# Patient Record
Sex: Female | Born: 1995 | Race: White | Hispanic: No | Marital: Single | Smoking: Former smoker
Health system: Southern US, Community
[De-identification: ages and names within clinical notes are randomized; demographics above are authoritative.]

## PROBLEM LIST (undated history)

## (undated) ENCOUNTER — Inpatient Hospital Stay (HOSPITAL_COMMUNITY): Payer: Self-pay

## (undated) DIAGNOSIS — Z8619 Personal history of other infectious and parasitic diseases: Secondary | ICD-10-CM

## (undated) DIAGNOSIS — K219 Gastro-esophageal reflux disease without esophagitis: Secondary | ICD-10-CM

## (undated) HISTORY — DX: Personal history of other infectious and parasitic diseases: Z86.19

## (undated) HISTORY — PX: WISDOM TOOTH EXTRACTION: SHX21

---
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
## 2014-01-16 ENCOUNTER — Encounter (HOSPITAL_COMMUNITY): Payer: Self-pay

## 2014-07-07 ENCOUNTER — Encounter (HOSPITAL_COMMUNITY): Payer: Self-pay | Admitting: *Deleted

## 2014-07-07 ENCOUNTER — Inpatient Hospital Stay (HOSPITAL_COMMUNITY)
Admission: AD | Admit: 2014-07-07 | Discharge: 2014-07-07 | Disposition: A | Discharge status: Home / Self Care | Payer: Medicaid Other | Source: Ambulatory Visit | Attending: Obstetrics & Gynecology | Admitting: Obstetrics & Gynecology

## 2014-07-07 DIAGNOSIS — Z87891 Personal history of nicotine dependence: Secondary | ICD-10-CM | POA: Diagnosis not present

## 2014-07-07 DIAGNOSIS — Z3A33 33 weeks gestation of pregnancy: Secondary | ICD-10-CM | POA: Diagnosis not present

## 2014-07-07 DIAGNOSIS — O9989 Other specified diseases and conditions complicating pregnancy, childbirth and the puerperium: Secondary | ICD-10-CM | POA: Diagnosis present

## 2014-07-07 DIAGNOSIS — O23593 Infection of other part of genital tract in pregnancy, third trimester: Secondary | ICD-10-CM | POA: Insufficient documentation

## 2014-07-07 DIAGNOSIS — N760 Acute vaginitis: Secondary | ICD-10-CM | POA: Insufficient documentation

## 2014-07-07 DIAGNOSIS — O2343 Unspecified infection of urinary tract in pregnancy, third trimester: Secondary | ICD-10-CM | POA: Diagnosis not present

## 2014-07-07 DIAGNOSIS — B9689 Other specified bacterial agents as the cause of diseases classified elsewhere: Secondary | ICD-10-CM | POA: Insufficient documentation

## 2014-07-07 HISTORY — DX: Gastro-esophageal reflux disease without esophagitis: K21.9

## 2014-07-07 LAB — URINALYSIS, ROUTINE W REFLEX MICROSCOPIC
BILIRUBIN URINE: NEGATIVE
GLUCOSE, UA: NEGATIVE mg/dL
KETONES UR: NEGATIVE mg/dL
Nitrite: NEGATIVE
PH: 6.5 (ref 5.0–8.0)
PROTEIN: NEGATIVE mg/dL
Specific Gravity, Urine: 1.02 (ref 1.005–1.030)
Urobilinogen, UA: 0.2 mg/dL (ref 0.0–1.0)

## 2014-07-07 LAB — URINE MICROSCOPIC-ADD ON

## 2014-07-07 LAB — WET PREP, GENITAL
Trich, Wet Prep: NONE SEEN
Yeast Wet Prep HPF POC: NONE SEEN

## 2014-07-07 LAB — AMNISURE RUPTURE OF MEMBRANE (ROM) NOT AT ARMC: AMNISURE: NEGATIVE

## 2014-07-07 MED ORDER — METRONIDAZOLE 500 MG PO TABS: 500.0000 mg | ORAL_TABLET | Freq: Two times a day (BID) | ORAL | Status: DC

## 2014-07-07 NOTE — MAU Note (Signed)
Contractions, un-timed, braxton hicks per patient  Complains of small amount bright red vaginal bleeding. Has lost mucous plug   Positive bloody show per patient   Decreased  fetal movement.  Complains of SROM/LOF since 1630 today

## 2014-07-07 NOTE — Discharge Instructions (Signed)
Asymptomatic Bacteriuria °Asymptomatic bacteriuria is the presence of a large number of bacteria in your urine without the usual symptoms of burning or frequent urination. The following conditions increase the risk of asymptomatic bacteriuria: °· Diabetes mellitus. °· Advanced age. °· Pregnancy in the first trimester. °· Kidney stones. °· Kidney transplants. °· Leaky kidney tube valve in young children (reflux). °Treatment for this condition is not needed in most people and can lead to other problems such as too much yeast and growth of resistant bacteria. However, some people, such as pregnant women, do need treatment to prevent kidney infection. Asymptomatic bacteriuria in pregnancy is also associated with fetal growth restriction, premature labor, and newborn death. °HOME CARE INSTRUCTIONS °Monitor your condition for any changes. The following actions may help to relieve any discomfort you are feeling: °· Drink enough water and fluids to keep your urine clear or pale yellow. Go to the bathroom more often to keep your bladder empty. °· Keep the area around your vagina and rectum clean. Wipe yourself from front to back after urinating. °SEEK IMMEDIATE MEDICAL CARE IF: °· You develop signs of an infection such as: °¨ Burning with urination. °¨ Frequency of voiding. °¨ Back pain. °¨ Fever. °· You have blood in the urine. °· You develop a fever. °MAKE SURE YOU: °· Understand these instructions. °· Will watch your condition. °· Will get help right away if you are not doing well or get worse. °Document Released: 03/03/2005 Document Revised: 07/18/2013 Document Reviewed: 08/23/2012 °ExitCare® Patient Information ©2015 [employer]. This information is not intended to replace advice given to you by your health care provider. Make sure you discuss any questions you have with your health care provider. °Bacterial Vaginosis °Bacterial vaginosis is a vaginal infection that occurs when the normal balance of bacteria in the  vagina is disrupted. It results from an overgrowth of certain bacteria. This is the most common vaginal infection in women of childbearing age. Treatment is important to prevent complications, especially in pregnant women, as it can cause a premature delivery. °CAUSES  °Bacterial vaginosis is caused by an increase in harmful bacteria that are normally present in smaller amounts in the vagina. Several different kinds of bacteria can cause bacterial vaginosis. However, the reason that the condition develops is not fully understood. °RISK FACTORS °Certain activities or behaviors can put you at an increased risk of developing bacterial vaginosis, including: °· Having a new sex partner or multiple sex partners. °· Douching. °· Using an intrauterine device (IUD) for contraception. °Women do not get bacterial vaginosis from toilet seats, bedding, swimming pools, or contact with objects around them. °SIGNS AND SYMPTOMS  °Some women with bacterial vaginosis have no signs or symptoms. Common symptoms include: °· Grey vaginal discharge. °· A fishlike odor with discharge, especially after sexual intercourse. °· Itching or burning of the vagina and vulva. °· Burning or pain with urination. °DIAGNOSIS  °Your health care provider will take a medical history and examine the vagina for signs of bacterial vaginosis. A sample of vaginal fluid may be taken. Your health care provider will look at this sample under a microscope to check for bacteria and abnormal cells. A vaginal pH test may also be done.  °TREATMENT  °Bacterial vaginosis may be treated with antibiotic medicines. These may be given in the form of a pill or a vaginal cream. A second round of antibiotics may be prescribed if the condition comes back after treatment.  °HOME CARE INSTRUCTIONS  °· Only take over-the-counter or prescription   medicines as directed by your health care provider. °· If antibiotic medicine was prescribed, take it as directed. Make sure you finish it  even if you start to feel better. °· Do not have sex until treatment is completed. °· Tell all sexual partners that you have a vaginal infection. They should see their health care provider and be treated if they have problems, such as a mild rash or itching. °· Practice safe sex by using condoms and only having one sex partner. °SEEK MEDICAL CARE IF:  °· Your symptoms are not improving after 3 days of treatment. °· You have increased discharge or pain. °· You have a fever. °MAKE SURE YOU:  °· Understand these instructions. °· Will watch your condition. °· Will get help right away if you are not doing well or get worse. °FOR MORE INFORMATION  °Centers for Disease Control and Prevention, Division of STD Prevention: www.cdc.gov/std °American Sexual Health Association (ASHA): www.ashastd.org  °Document Released: 03/03/2005 Document Revised: 12/22/2012 Document Reviewed: 10/13/2012 °ExitCare® Patient Information ©2015 [employer]. This information is not intended to replace advice given to you by your health care provider. Make sure you discuss any questions you have with your health care provider. ° °

## 2014-07-07 NOTE — MAU Provider Note (Signed)
Chief Complaint: Rupture of Membranes   First Provider Initiated Contact with Patient 07/07/14 1845     SUBJECTIVE HPI: Peggy Dodson is a 19 y.o. G2P1001 at [redacted]w[redacted]d by LMP who reports losing her mucous plug last night and at 1645 today having a large gush of clear fluid which ran down her legs prior to urinating. States she has been leaking since then. Denies contractions. Denied vaginal bleeding until during her time in triage stated she had bright red blood on the toilet paper. Antecedent intercourse about one day ago. She reports decreased fetal movement earlier today; now perceiving good FM. No irritative vaginal discharge. Denies dysuria or urgency but has had frequent urination.  Addendum: States she picked up a prescription for Macrobid for UTI today. It had been called in to pharmacy 1-1/2 weeks ago after her last prenatal visit but she has not begun taking it yet.   Past Medical History  Diagnosis Date  . Hx of varicella   . GERD (gastroesophageal reflux disease)    OB History  Gravida Para Term Preterm AB SAB TAB Ectopic Multiple Living  # Outcome Date GA Lbr Len/2nd Weight Sex Delivery Anes PTL Lv  2 Current           1 Term 06/22/12 [redacted]w[redacted]d  8 lb 8.5 oz (3.87 kg) F CS-LTranv Spinal  Y     Past Surgical History  Procedure Laterality Date  . Wisdom tooth extraction    . Cesarean section N/A 06/22/2012    Procedure: CESAREAN SECTION;  Surgeon: Freddrick March. Tenny Craw, MD;  Location: [facility];  Service: Obstetrics;  Laterality: N/A;   History   Social History  . Marital Status: Single    Spouse Name: N/A  . Number of Children: N/A  . Years of Education: N/A   Occupational History  . Not on file.   Social History Main Topics  . Smoking status: Former Smoker    Quit date: 12/15/2013  . Smokeless tobacco: Never Used  . Alcohol Use: No  . Drug Use: No  . Sexual Activity: Yes    Birth Control/ Protection: None   Other Topics Concern  . Not on file   Social  History Narrative   No current facility-administered medications on file prior to encounter.   Current Outpatient Prescriptions on File Prior to Encounter  Medication Sig Dispense Refill  . Prenatal Vit-Fe Fumarate-FA (PRENATAL MULTIVITAMIN) TABS Take 1 tablet by mouth at bedtime.    Marland Kitchen oxyCODONE-acetaminophen (PERCOCET/ROXICET) 5-325 MG per tablet Take 1-2 tablets by mouth every 4 (four) hours as needed. (Patient not taking: Reported on 07/07/2014) 30 tablet 0   No Known Allergies  ROS  OBJECTIVE Blood pressure 116/63, pulse 81, temperature 97.8 F (36.6 C), resp. rate 18, height  (1.702 m), weight 192 lb (87.091 kg), last menstrual period 11/17/2013, unknown if currently breastfeeding. GENERAL: Well-developed, well-nourished female in no acute distress. Peri-pad dry. HEART: normal rate RESP: normal effort GI: Abdomen soft, non-tender. S=D MS: Nontender, no edema; neg CVAT NEURO: Alert and oriented SSE: Slight labial edema, physiologic discharge, no blood noted, cervix clean; neg pool; neg fern   LAB RESULTS Results for orders placed or performed during the hospital encounter of 07/07/14 (from the past 24 hour(s))  Urinalysis, Routine w reflex microscopic     Status: Abnormal   Collection Time: 07/07/14  5:50 PM  Result Value Ref Range   Color, Urine YELLOW  YELLOW   APPearance CLOUDY (A) CLEAR   Specific Gravity, Urine 1.020 1.005 - 1.030   pH 6.5 5.0 - 8.0   Glucose, UA NEGATIVE NEGATIVE mg/dL   Hgb urine dipstick SMALL (A) NEGATIVE   Bilirubin Urine NEGATIVE NEGATIVE   Ketones, ur NEGATIVE NEGATIVE mg/dL   Protein, ur NEGATIVE NEGATIVE mg/dL   Urobilinogen, UA 0.2 0.0 - 1.0 mg/dL   Nitrite NEGATIVE NEGATIVE   Leukocytes, UA LARGE (A) NEGATIVE  Urine microscopic-add on     Status: Abnormal   Collection Time: 07/07/14  5:50 PM  Result Value Ref Range   Squamous Epithelial / LPF RARE RARE   WBC, UA TOO NUMEROUS TO COUNT <3 WBC/hpf   RBC / HPF 3-6 <3 RBC/hpf    Bacteria, UA MANY (A) RARE  Amnisure rupture of membrane (rom)     Status: None   Collection Time: 07/07/14  7:05 PM  Result Value Ref Range   Amnisure ROM NEGATIVE   Wet prep, genital     Status: Abnormal   Collection Time: 07/07/14  7:38 PM  Result Value Ref Range   Yeast Wet Prep HPF POC NONE SEEN NONE SEEN   Trich, Wet Prep NONE SEEN NONE SEEN   Clue Cells Wet Prep HPF POC FEW (A) NONE SEEN   WBC, Wet Prep HPF POC FEW (A) NONE SEEN    IMAGING No results found.  MAU COURSE C/W Dr. [facility]inn [facility]   ASSESSMENT 1. UTI (urinary tract infection) during pregnancy, third trimester   2. BV (bacterial vaginosis)   G2P1001 at 3220w1d, without evident SROM or VB  PLAN Discharge home in stable condition. PTL and FM precautions    Medication List    STOP taking these medications        oxyCODONE-acetaminophen 5-325 MG per tablet  Commonly known as:  PERCOCET/ROXICET      TAKE these medications        metroNIDAZOLE 500 MG tablet  Commonly known as:  FLAGYL  Take 1 tablet (500 mg total) by mouth 2 (two) times daily.     prenatal multivitamin Tabs tablet  Take 1 tablet by mouth at bedtime.      Start taking the Macrobid (prescribed through office) tonight. Increase fluids.  Follow-up Information    [facility] Sanjuana MaeWALDA STACIA, MD.   Specialty:  Obstetrics and Gynecology   Why:  Keep your scheduled prenatal appointment   Contact information:   [address] [place] [id number] [phone]       Danae OrleansDeirdre C Casimir Barcellos, CNM 07/07/2014  6:55 PM

## 2014-07-07 NOTE — MAU Note (Signed)
Pt presents to MAU with complaints of a large gush of fluid around 5 today. Denies any vaginal bleeding or contractions

## 2014-07-31 LAB — OB RESULTS CONSOLE GBS: STREP GROUP B AG: NEGATIVE

## 2014-08-16 ENCOUNTER — Encounter (HOSPITAL_COMMUNITY): Payer: Self-pay | Admitting: *Deleted

## 2014-08-16 ENCOUNTER — Inpatient Hospital Stay (HOSPITAL_COMMUNITY)
Admission: AD | Admit: 2014-08-16 | Discharge: 2014-08-16 | Disposition: A | Discharge status: Home / Self Care | Payer: Medicaid Other | Source: Ambulatory Visit | Attending: Obstetrics and Gynecology | Admitting: Obstetrics and Gynecology

## 2014-08-16 DIAGNOSIS — Z3A39 39 weeks gestation of pregnancy: Secondary | ICD-10-CM | POA: Insufficient documentation

## 2014-08-16 DIAGNOSIS — O9989 Other specified diseases and conditions complicating pregnancy, childbirth and the puerperium: Secondary | ICD-10-CM | POA: Diagnosis present

## 2014-08-16 NOTE — Discharge Instructions (Signed)

## 2014-08-16 NOTE — MAU Note (Signed)
Contractions, denies bleeding or ROM 

## 2015-05-12 ENCOUNTER — Encounter (HOSPITAL_COMMUNITY): Payer: Self-pay | Admitting: *Deleted

## 2016-04-22 LAB — OB RESULTS CONSOLE ABO/RH: RH Type: POSITIVE

## 2016-04-22 LAB — OB RESULTS CONSOLE HIV ANTIBODY (ROUTINE TESTING): HIV: NONREACTIVE

## 2016-04-22 LAB — OB RESULTS CONSOLE HEPATITIS B SURFACE ANTIGEN: Hepatitis B Surface Ag: NEGATIVE

## 2016-04-22 LAB — OB RESULTS CONSOLE ANTIBODY SCREEN: Antibody Screen: NEGATIVE

## 2016-04-22 LAB — OB RESULTS CONSOLE GC/CHLAMYDIA
Chlamydia: POSITIVE
GC PROBE AMP, GENITAL: NEGATIVE

## 2016-04-22 LAB — OB RESULTS CONSOLE RUBELLA ANTIBODY, IGM: Rubella: IMMUNE

## 2016-04-22 LAB — OB RESULTS CONSOLE RPR: RPR: NONREACTIVE

## 2016-05-20 ENCOUNTER — Other Ambulatory Visit: Payer: Self-pay

## 2016-05-20 LAB — OB RESULTS CONSOLE GC/CHLAMYDIA: CHLAMYDIA, DNA PROBE: NEGATIVE

## 2016-06-10 ENCOUNTER — Other Ambulatory Visit: Payer: Self-pay | Admitting: Obstetrics and Gynecology

## 2016-06-10 ENCOUNTER — Other Ambulatory Visit: Payer: Self-pay

## 2016-06-10 ENCOUNTER — Encounter (HOSPITAL_COMMUNITY): Payer: Self-pay | Admitting: *Deleted

## 2016-06-10 ENCOUNTER — Inpatient Hospital Stay (HOSPITAL_COMMUNITY)
Admission: AD | Admit: 2016-06-10 | Discharge: 2016-06-10 | Disposition: A | Discharge status: Home / Self Care | Payer: Medicaid Other | Source: Ambulatory Visit | Attending: Obstetrics and Gynecology | Admitting: Obstetrics and Gynecology

## 2016-06-10 DIAGNOSIS — O26892 Other specified pregnancy related conditions, second trimester: Secondary | ICD-10-CM | POA: Insufficient documentation

## 2016-06-10 DIAGNOSIS — Z3A20 20 weeks gestation of pregnancy: Secondary | ICD-10-CM | POA: Insufficient documentation

## 2016-06-10 DIAGNOSIS — R55 Syncope and collapse: Secondary | ICD-10-CM | POA: Insufficient documentation

## 2016-06-10 NOTE — MAU Note (Signed)
PT taken over to admitting for EKG. PT came to MAU on error

## 2016-06-10 NOTE — MAU Note (Signed)
Pt presents to MAU for follow up after being evaluated by Dr Henderson CloudHorvath for an abnormal anatomy scan today. Denies pain or VB

## 2016-06-13 ENCOUNTER — Other Ambulatory Visit (HOSPITAL_COMMUNITY): Payer: Self-pay | Admitting: Obstetrics and Gynecology

## 2016-06-13 DIAGNOSIS — O283 Abnormal ultrasonic finding on antenatal screening of mother: Secondary | ICD-10-CM

## 2016-06-13 DIAGNOSIS — Z3A19 19 weeks gestation of pregnancy: Secondary | ICD-10-CM

## 2016-06-13 DIAGNOSIS — Z3689 Encounter for other specified antenatal screening: Secondary | ICD-10-CM

## 2016-06-17 ENCOUNTER — Ambulatory Visit (HOSPITAL_COMMUNITY)
Admission: RE | Admit: 2016-06-17 | Discharge: 2016-06-17 | Disposition: A | Discharge status: Home / Self Care | Payer: Medicaid Other | Source: Ambulatory Visit | Attending: Obstetrics and Gynecology | Admitting: Obstetrics and Gynecology

## 2016-06-17 ENCOUNTER — Encounter (HOSPITAL_COMMUNITY): Payer: Self-pay

## 2016-06-17 DIAGNOSIS — Z3689 Encounter for other specified antenatal screening: Secondary | ICD-10-CM | POA: Insufficient documentation

## 2016-06-17 DIAGNOSIS — O283 Abnormal ultrasonic finding on antenatal screening of mother: Secondary | ICD-10-CM | POA: Insufficient documentation

## 2016-06-17 DIAGNOSIS — Z363 Encounter for antenatal screening for malformations: Secondary | ICD-10-CM | POA: Diagnosis not present

## 2016-06-17 DIAGNOSIS — Z3A19 19 weeks gestation of pregnancy: Secondary | ICD-10-CM | POA: Diagnosis not present

## 2016-06-17 DIAGNOSIS — O34219 Maternal care for unspecified type scar from previous cesarean delivery: Secondary | ICD-10-CM | POA: Insufficient documentation

## 2016-06-18 ENCOUNTER — Other Ambulatory Visit (HOSPITAL_COMMUNITY): Payer: Self-pay | Admitting: *Deleted

## 2016-06-18 DIAGNOSIS — O359XX0 Maternal care for (suspected) fetal abnormality and damage, unspecified, not applicable or unspecified: Secondary | ICD-10-CM

## 2016-07-15 ENCOUNTER — Ambulatory Visit (HOSPITAL_COMMUNITY)
Admission: RE | Admit: 2016-07-15 | Discharge: 2016-07-15 | Disposition: A | Discharge status: Home / Self Care | Payer: Medicaid Other | Source: Ambulatory Visit | Attending: Obstetrics and Gynecology | Admitting: Obstetrics and Gynecology

## 2016-07-15 ENCOUNTER — Encounter (HOSPITAL_COMMUNITY): Payer: Self-pay

## 2016-09-10 ENCOUNTER — Encounter (HOSPITAL_COMMUNITY): Payer: Self-pay | Admitting: *Deleted

## 2016-09-10 ENCOUNTER — Inpatient Hospital Stay (HOSPITAL_COMMUNITY)
Admission: AD | Admit: 2016-09-10 | Discharge: 2016-09-10 | Disposition: A | Discharge status: Home / Self Care | Payer: Medicaid Other | Source: Ambulatory Visit | Attending: Obstetrics | Admitting: Obstetrics

## 2016-09-10 DIAGNOSIS — O9989 Other specified diseases and conditions complicating pregnancy, childbirth and the puerperium: Secondary | ICD-10-CM | POA: Diagnosis not present

## 2016-09-10 DIAGNOSIS — B3731 Acute candidiasis of vulva and vagina: Secondary | ICD-10-CM

## 2016-09-10 DIAGNOSIS — R197 Diarrhea, unspecified: Secondary | ICD-10-CM

## 2016-09-10 DIAGNOSIS — Z87891 Personal history of nicotine dependence: Secondary | ICD-10-CM | POA: Diagnosis not present

## 2016-09-10 DIAGNOSIS — B373 Candidiasis of vulva and vagina: Secondary | ICD-10-CM | POA: Insufficient documentation

## 2016-09-10 DIAGNOSIS — Z3A31 31 weeks gestation of pregnancy: Secondary | ICD-10-CM | POA: Insufficient documentation

## 2016-09-10 DIAGNOSIS — O98813 Other maternal infectious and parasitic diseases complicating pregnancy, third trimester: Secondary | ICD-10-CM | POA: Insufficient documentation

## 2016-09-10 DIAGNOSIS — O368130 Decreased fetal movements, third trimester, not applicable or unspecified: Secondary | ICD-10-CM

## 2016-09-10 DIAGNOSIS — Z8249 Family history of ischemic heart disease and other diseases of the circulatory system: Secondary | ICD-10-CM | POA: Diagnosis not present

## 2016-09-10 DIAGNOSIS — Z3689 Encounter for other specified antenatal screening: Secondary | ICD-10-CM

## 2016-09-10 DIAGNOSIS — N898 Other specified noninflammatory disorders of vagina: Secondary | ICD-10-CM | POA: Diagnosis present

## 2016-09-10 LAB — URINALYSIS, ROUTINE W REFLEX MICROSCOPIC
Bilirubin Urine: NEGATIVE
Glucose, UA: NEGATIVE mg/dL
Hgb urine dipstick: NEGATIVE
Ketones, ur: 5 mg/dL — AB
NITRITE: NEGATIVE
PH: 7 (ref 5.0–8.0)
Protein, ur: 30 mg/dL — AB
SPECIFIC GRAVITY, URINE: 1.016 (ref 1.005–1.030)

## 2016-09-10 LAB — WET PREP, GENITAL
CLUE CELLS WET PREP: NONE SEEN
SPERM: NONE SEEN
TRICH WET PREP: NONE SEEN

## 2016-09-10 LAB — POCT FERN TEST: POCT Fern Test: NEGATIVE

## 2016-09-10 MED ORDER — TERCONAZOLE 0.8 % VA CREA: 1.0000 | TOPICAL_CREAM | Freq: Every day | VAGINAL | 0 refills | Status: DC

## 2016-09-10 NOTE — MAU Note (Signed)
Lost her mucous plug, small amt of watery d/c no bleeding. Having contractions, feeling pelvic pressure.  No hx of PTL.  BP has been elevated,pt is not on any meds for it.

## 2016-09-10 NOTE — Discharge Instructions (Signed)
Nausea medication to take during pregnancy:  Unisom (doxylamine succinate 25 mg tablets) Take one tablet daily at bedtime. If symptoms are not adequately controlled, the dose can be increased to a maximum recommended dose of two tablets daily (1/2 tablet in the morning, 1/2 tablet mid-afternoon and one at bedtime). Vitamin B6 100mg  tablets. Take one tablet twice a day (up to 200 mg per day).     Diarrhea, Adult Diarrhea is frequent loose and watery bowel movements. Diarrhea can make you feel weak and cause you to become dehydrated. Dehydration can make you tired and thirsty, cause you to have a dry mouth, and decrease how often you urinate. Diarrhea typically lasts 2-3 days. However, it can last longer if it is a sign of something more serious. It is important to treat your diarrhea as told by your health care provider. Follow these instructions at home: Eating and drinking  Follow these recommendations as told by your health care provider:  Take an oral rehydration solution (ORS). This is a drink that is sold at pharmacies and retail stores.  Drink clear fluids, such as water, ice chips, diluted fruit juice, and low-calorie sports drinks.  Eat bland, easy-to-digest foods in small amounts as you are able. These foods include bananas, applesauce, rice, lean meats, toast, and crackers.  Avoid drinking fluids that contain a lot of sugar or caffeine, such as energy drinks, sports drinks, and soda.  Avoid alcohol.  Avoid spicy or fatty foods.  General instructions  Drink enough fluid to keep your urine clear or pale yellow.  Wash your hands often. If soap and water are not available, use hand sanitizer.  Make sure that all people in your household wash their hands well and often.  Take over-the-counter and prescription medicines only as told by your health care provider.  Rest at home while you recover.  Watch your condition for any changes.  Take a warm bath to relieve any burning  or pain from frequent diarrhea episodes.  Keep all follow-up visits as told by your health care provider. This is important. Contact a health care provider if:  You have a fever.  Your diarrhea gets worse.  You have new symptoms.  You cannot keep fluids down.  You feel light-headed or dizzy.  You have a headache  You have muscle cramps. Get help right away if:  You have chest pain.  You feel extremely weak or you faint.  You have bloody or black stools or stools that look like tar.  You have severe pain, cramping, or bloating in your abdomen.  You have trouble breathing or you are breathing very quickly.  Your heart is beating very quickly.  Your skin feels cold and clammy.  You feel confused.  You have signs of dehydration, such as: ? Dark urine, very little urine, or no urine. ? Cracked lips. ? Dry mouth. ? Sunken eyes. ? Sleepiness. ? Weakness. This information is not intended to replace advice given to you by your health care provider. Make sure you discuss any questions you have with your health care provider. Document Released: 02/21/2002 Document Revised: 07/12/2015 Document Reviewed: 11/07/2014 Elsevier Interactive Patient Education  2017 Elsevier Inc.  [place] Diet A bland diet consists of foods that do not have a lot of fat or fiber. Foods without fat or fiber are easier for the body to digest. They are also less likely to irritate your mouth, throat, stomach, and other parts of your gastrointestinal tract. A bland diet is sometimes  called a BRAT diet. What is my plan? Your health care provider or dietitian may recommend specific changes to your diet to prevent and treat your symptoms, such as:  Eating small meals often.  Cooking food until it is soft enough to chew easily.  Chewing your food well.  Drinking fluids slowly.  Not eating foods that are very spicy, sour, or fatty.  Not eating citrus fruits, such as oranges and grapefruit.  What do  I need to know about this diet?  Eat a variety of foods from the bland diet food list.  Do not follow a bland diet longer than you have to.  Ask your health care provider whether you should take vitamins. What foods can I eat? Grains  Hot cereals, such as cream of wheat. Bread, crackers, or tortillas made from refined white flour. Rice. Vegetables Canned or cooked vegetables. Mashed or boiled potatoes. Fruits Bananas. Applesauce. Other types of cooked or canned fruit with the skin and seeds removed, such as canned peaches or pears. Meats and Other Protein Sources Scrambled eggs. Creamy peanut butter or other nut butters. Lean, well-cooked meats, such as chicken or fish. Tofu. Soups or broths. Dairy Low-fat dairy products, such as milk, cottage cheese, or yogurt. Beverages Water. Herbal tea. Apple juice. Sweets and Desserts Pudding. Custard. Fruit gelatin. Ice cream. Fats and Oils Mild salad dressings. Canola or olive oil. The items listed above may not be a complete list of allowed foods or beverages. Contact your dietitian for more options. What foods are not recommended? Foods and ingredients that are often not recommended include:  Spicy foods, such as hot sauce or salsa.  Fried foods.  Sour foods, such as pickled or fermented foods.  Raw vegetables or fruits, especially citrus or berries.  Caffeinated drinks.  Alcohol.  Strongly flavored seasonings or condiments.  The items listed above may not be a complete list of foods and beverages that are not allowed. Contact your dietitian for more information. This information is not intended to replace advice given to you by your health care provider. Make sure you discuss any questions you have with your health care provider. Document Released: 06/25/2015 Document Revised: 08/09/2015 Document Reviewed: 03/15/2014 Elsevier Interactive Patient Education  2018 Elsevier Inc.  Fetal Movement Counts Patient Name:  ________________________________________________ Patient Due Date: ____________________ What is a fetal movement count? A fetal movement count is the number of times that you feel your baby move during a certain amount of time. This may also be called a fetal kick count. A fetal movement count is recommended for every pregnant woman. You may be asked to start counting fetal movements as early as week 28 of your pregnancy. Pay attention to when your baby is most active. You may notice your baby's sleep and wake cycles. You may also notice things that make your baby move more. You should do a fetal movement count:  When your baby is normally most active.  At the same time each day.  A good time to count movements is while you are resting, after having something to eat and drink. How do I count fetal movements? 1. Find a quiet, comfortable area. Sit, or lie down on your side. 2. Write down the date, the start time and stop time, and the number of movements that you felt between those two times. Take this information with you to your health care visits. 3. For 2 hours, count kicks, flutters, swishes, rolls, and jabs. You should feel at least 10 movements  during 2 hours. 4. You may stop counting after you have felt 10 movements. 5. If you do not feel 10 movements in 2 hours, have something to eat and drink. Then, keep resting and counting for 1 hour. If you feel at least 4 movements during that hour, you may stop counting. Contact a health care provider if:  You feel fewer than 4 movements in 2 hours.  Your baby is not moving like he or she usually does. Date: ____________ Start time: ____________ Stop time: ____________ Movements: ____________ Date: ____________ Start time: ____________ Stop time: ____________ Movements: ____________ Date: ____________ Start time: ____________ Stop time: ____________ Movements: ____________ Date: ____________ Start time: ____________ Stop time: ____________  Movements: ____________ Date: ____________ Start time: ____________ Stop time: ____________ Movements: ____________ Date: ____________ Start time: ____________ Stop time: ____________ Movements: ____________ Date: ____________ Start time: ____________ Stop time: ____________ Movements: ____________ Date: ____________ Start time: ____________ Stop time: ____________ Movements: ____________ Date: ____________ Start time: ____________ Stop time: ____________ Movements: ____________ This information is not intended to replace advice given to you by your health care provider. Make sure you discuss any questions you have with your health care provider. Document Released: 04/02/2006 Document Revised: 10/31/2015 Document Reviewed: 04/12/2015 Elsevier Interactive Patient Education  [employer]2018 Elsevier Inc.

## 2016-09-10 NOTE — MAU Note (Signed)
Has been vomiting and having watery stools for 2 wks, it was at that time that the cramping and contractions started.

## 2016-09-10 NOTE — MAU Provider Note (Signed)
History     CSN: [id number]  Arrival date and time: 09/10/16 1317  First Provider Initiated Contact with Patient 09/10/16 1353   Chief Complaint  Patient presents with  . Vaginal Discharge  . Contractions  . Diarrhea  . Decreased Fetal Movement   HPI  Peggy Dodson is a 21 y.o. G3P2002 at [redacted]w[redacted]d who presents with multiple complaints. Reports decreased fetal movement x 2 days. Has felt movement once per day since Monday.  Lower abdominal cramping x 2 weeks that comes & goes. Rates pain 4/10. Has not treated. Can't tell how frequently pain occurs. Vaginal discharge since this morning. Describes as alternating between watery fluid & mucous plug. Keeps underwear wet; not gushing or trickling. Denies vaginal bleeding. Endorses diarrhea x 2 weeks. Reports 4 episodes of watery stools daily. Denies fever/chills, vomiting, sick contacts, recent travel, antibiotics, or hospitalization. No recent intercourse.   OB History    Gravida Para Term Preterm AB Living   3 2 2     2    SAB TAB Ectopic Multiple Live Births         1 2      Past Medical History:  Diagnosis Date  . GERD (gastroesophageal reflux disease)   . Hx of varicella     Past Surgical History:  Procedure Laterality Date  . CESAREAN SECTION N/A 06/22/2012   Procedure: CESAREAN SECTION;  Surgeon: Freddrick March. Tenny Craw, MD;  Location: [facility];  Service: Obstetrics;  Laterality: N/A;  . WISDOM TOOTH EXTRACTION      Family History  Problem Relation Age of Onset  . Hypertension Father   . Alcohol abuse Paternal Grandfather   . Cancer Paternal Grandfather        liver  . COPD Maternal Grandmother     Social History  Substance Use Topics  . Smoking status: Former Smoker    Types: Cigarettes    Quit date: 12/15/2013  . Smokeless tobacco: Never Used  . Alcohol use No    Allergies: No Known Allergies  Prescriptions Prior to Admission  Medication Sig Dispense Refill Last Dose  . metroNIDAZOLE (FLAGYL) 500 MG tablet Take 1 tablet  (500 mg total) by mouth 2 (two) times daily. (Patient not taking: Reported on 06/17/2016) 14 tablet 0 Not Taking  . Prenatal Vit-Fe Fumarate-FA (PRENATAL MULTIVITAMIN) TABS Take 1 tablet by mouth at bedtime.   Taking    Review of Systems  Constitutional: Negative.   Gastrointestinal: Positive for abdominal pain, diarrhea and nausea. Negative for blood in stool, constipation and vomiting.  Genitourinary: Positive for vaginal discharge. Negative for dysuria and vaginal bleeding.  Musculoskeletal: Negative for back pain.   Physical Exam   Blood pressure (!) 124/50, pulse 82, temperature 98.2 F (36.8 C), resp. rate 18, weight 203 lb 8 oz (92.3 kg), last menstrual period 01/31/2016, SpO2 98 %, unknown if currently breastfeeding.  Physical Exam  Nursing note and vitals reviewed. Constitutional: She is oriented to person, place, and time. She appears well-developed and well-nourished. No distress.  HENT:  Head: Normocephalic and atraumatic.  Eyes: Conjunctivae are normal. Right eye exhibits no discharge. Left eye exhibits no discharge. No scleral icterus.  Neck: Normal range of motion.  Cardiovascular: Normal rate, regular rhythm and normal heart sounds.   No murmur heard. Respiratory: Effort normal and breath sounds normal. No respiratory distress. She has no wheezes.  GI: Soft. There is no tenderness.  Genitourinary: Cervix exhibits no friability. No bleeding in the vagina. Vaginal discharge (small amount of thin  white discharge adherant to vaginal walls) found.  Genitourinary Comments: No pooling  Neurological: She is alert and oriented to person, place, and time.  Skin: Skin is warm and dry. She is not diaphoretic.  Psychiatric: She has a normal mood and affect. Her behavior is normal. Judgment and thought content normal.   Dilation: Closed Effacement (%): Thick Exam by:: Emila Steinhauser NP  Fetal Tracing:  Baseline: 140 Variability: moderate Accelerations: 15x15 Decelerations:  none  Toco: irregular UI MAU Course  Procedures Results for orders placed or performed during the hospital encounter of 09/10/16 (from the past 24 hour(s))  Urinalysis, Routine w reflex microscopic     Status: Abnormal   Collection Time: 09/10/16  1:44 PM  Result Value Ref Range   Color, Urine YELLOW YELLOW   APPearance CLOUDY (A) CLEAR   Specific Gravity, Urine 1.016 1.005 - 1.030   pH 7.0 5.0 - 8.0   Glucose, UA NEGATIVE NEGATIVE mg/dL   Hgb urine dipstick NEGATIVE NEGATIVE   Bilirubin Urine NEGATIVE NEGATIVE   Ketones, ur 5 (A) NEGATIVE mg/dL   Protein, ur 30 (A) NEGATIVE mg/dL   Nitrite NEGATIVE NEGATIVE   Leukocytes, UA LARGE (A) NEGATIVE   RBC / HPF 0-5 0 - 5 RBC/hpf   WBC, UA 6-30 0 - 5 WBC/hpf   Bacteria, UA RARE (A) NONE SEEN   Squamous Epithelial / LPF TOO NUMEROUS TO COUNT (A) NONE SEEN   Mucous PRESENT    Non Squamous Epithelial 0-5 (A) NONE SEEN  POCT fern test     Status: None   Collection Time: 09/10/16  2:05 PM  Result Value Ref Range   POCT Fern Test Negative = intact amniotic membranes   Wet prep, genital     Status: Abnormal   Collection Time: 09/10/16  2:50 PM  Result Value Ref Range   Yeast Wet Prep HPF POC PRESENT (A) NONE SEEN   Trich, Wet Prep NONE SEEN NONE SEEN   Clue Cells Wet Prep HPF POC NONE SEEN NONE SEEN   WBC, Wet Prep HPF POC MANY (A) NONE SEEN   Sperm NONE SEEN     MDM Reactive fetal tracing Reports normal fetal movement since being on monitor No pooling, fern negative. Wet prep shows yeast Cervix closed; no regular ctx on TOCO No diarrhea while in MAU S/w Dr. Chestine Sporelark. Ok to discharge home Assessment and Plan  A: 1. Decreased fetal movements in third trimester, single or unspecified fetus   2. NST (non-stress test) reactive   3. Vaginal yeast infection   4. Diarrhea, unspecified type    P: Discharge home Rx terazol Fetal kick counts reviewed BRAT diet for diarrhea Discussed reasons to return to MAU Keep follow up appointment  with OB/PCP   Judeth HornErin Aayla Marrocco 09/10/2016, 1:53 PM

## 2016-10-17 ENCOUNTER — Telehealth (HOSPITAL_COMMUNITY): Payer: Self-pay | Admitting: *Deleted

## 2016-10-17 ENCOUNTER — Encounter (HOSPITAL_COMMUNITY): Payer: Self-pay

## 2016-10-17 NOTE — Telephone Encounter (Signed)
Preadmission screen  

## 2016-10-20 ENCOUNTER — Encounter (HOSPITAL_COMMUNITY): Payer: Self-pay

## 2016-10-30 ENCOUNTER — Other Ambulatory Visit: Payer: Self-pay | Admitting: Obstetrics and Gynecology

## 2016-10-30 ENCOUNTER — Inpatient Hospital Stay (HOSPITAL_COMMUNITY)
Admission: AD | Admit: 2016-10-30 | Discharge: 2016-11-01 | DRG: 766 | Disposition: A | Discharge status: Home / Self Care | Payer: Medicaid Other | Source: Ambulatory Visit | Attending: Obstetrics | Admitting: Obstetrics

## 2016-10-30 DIAGNOSIS — Z3A39 39 weeks gestation of pregnancy: Secondary | ICD-10-CM

## 2016-10-30 DIAGNOSIS — O34211 Maternal care for low transverse scar from previous cesarean delivery: Secondary | ICD-10-CM | POA: Diagnosis present

## 2016-10-30 DIAGNOSIS — O99824 Streptococcus B carrier state complicating childbirth: Secondary | ICD-10-CM | POA: Diagnosis present

## 2016-10-30 DIAGNOSIS — Z87891 Personal history of nicotine dependence: Secondary | ICD-10-CM

## 2016-10-30 DIAGNOSIS — O4202 Full-term premature rupture of membranes, onset of labor within 24 hours of rupture: Secondary | ICD-10-CM | POA: Diagnosis present

## 2016-10-30 LAB — CBC
HEMATOCRIT: 31.3 % — AB (ref 36.0–46.0)
Hemoglobin: 10.7 g/dL — ABNORMAL LOW (ref 12.0–15.0)
MCH: 29.7 pg (ref 26.0–34.0)
MCHC: 34.2 g/dL (ref 30.0–36.0)
MCV: 86.9 fL (ref 78.0–100.0)
PLATELETS: 197 10*3/uL (ref 150–400)
RBC: 3.6 MIL/uL — ABNORMAL LOW (ref 3.87–5.11)
RDW: 13.6 % (ref 11.5–15.5)
WBC: 12.3 10*3/uL — ABNORMAL HIGH (ref 4.0–10.5)

## 2016-10-30 LAB — POCT FERN TEST: POCT Fern Test: POSITIVE

## 2016-10-30 MED ORDER — CEFAZOLIN SODIUM-DEXTROSE 2-4 GM/100ML-% IV SOLN
2.0000 g | INTRAVENOUS | Status: AC
  Administered 2016-10-31: 2 g via INTRAVENOUS
  Filled 2016-10-30: qty 100

## 2016-10-30 NOTE — MAU Note (Signed)
Pt presents with leaking fluid since 2100.  Pt reports fluid is clear.  States regular ctxs began once water broke.

## 2016-10-30 NOTE — H&P (Signed)
21 y.o. [id number] @ 3272w0d presents with LOF since 2100 and contractions q5 minutes.  Otherwise has good fetal movement and no bleeding.  Past Medical History:  Diagnosis Date  . GERD (gastroesophageal reflux disease)   . Hx of chlamydia infection   . Hx of varicella     Past Surgical History:  Procedure Laterality Date  . CESAREAN SECTION N/A 06/22/2012   Procedure: CESAREAN SECTION;  Surgeon: Freddrick MarchKendra H. Tenny Crawoss, MD;  Location: [facility];  Service: Obstetrics;  Laterality: N/A;  . WISDOM TOOTH EXTRACTION      OB History  Gravida Para Term Preterm AB Living  3 2 2     2   SAB TAB Ectopic Multiple Live Births        1 2    # Outcome Date GA Lbr Len/2nd Weight Sex Delivery Anes PTL Lv  3 Current           2 Term 2016     CS-Unspec  N LIV  1A Term 06/22/12 2465w3d  3.87 kg (8 lb 8.5 oz) F CS-LTranv Spinal  LIV  1B Term  4150w0d      N FD      Social History   Social History  . Marital status: Single    Spouse name: N/A  . Number of children: N/A  . Years of education: N/A   Occupational History  . Not on file.   Social History Main Topics  . Smoking status: Former Smoker    Types: Cigarettes    Quit date: 12/15/2013  . Smokeless tobacco: Never Used  . Alcohol use No  . Drug use: No  . Sexual activity: Yes    Birth control/ protection: None   Other Topics Concern  . Not on file   Social History Narrative  . No narrative on file   Patient has no known allergies.    Prenatal Transfer Tool  Maternal Diabetes: No Genetic Screening: Normal Maternal Ultrasounds/Referrals: Normal Fetal Ultrasounds or other Referrals:  None Maternal Substance Abuse:  No Significant Maternal Medications:  None Significant Maternal Lab Results: Lab values include: Other: + chlamydia in 04/2016. Negative test of cure  ABO, Rh: O/Positive/-- (02/06 0000) Antibody: Negative (02/06 0000) Rubella: Immune (02/06 0000) RPR: Nonreactive (02/06 0000)  HBsAg: Negative (02/06 0000)  HIV: Non-reactive (02/06  0000)  GBS:   Positive    Other PNC: uncomplicated.    Vitals:   10/30/16 2247  BP: 129/70  Pulse: 92  Resp: 20  Temp: 99.1 F (37.3 C)  SpO2: 97%     General:  NAD Abdomen:  soft, gravid Ex:  no edema SVE:  4/80-/2 FHTs:  120s, mod var, + accels, no decels Toco:  q3-5 min    A/P   21 y.o. [id number] 3872w0d presents with ROM for repeat cesarean section.   Discussed risks of cesarean section to include, but not limited to, infection, bleeding, damage to surrounding strutcures (including bowel, bladder, tubes, ovaries, nerves, vessels, baby), need for additional procedures, risk of blood clot, need for transfusion. Consent signed.  Ancef 2gm on call to OR  [facility] Hill[facility]ANNA GEFFEL Chestine SporeLARK

## 2016-10-30 NOTE — Patient Instructions (Signed)
20 Peggy MooreLacey R Dodson  10/30/2016   Your procedure is scheduled on:  11/03/2016  Enter through the [facility]tr[facility] of [facility] at 0630 AM.  Pick up the phone at the desk and dial [phone].   Call this number if you have problems the morning of surgery: [phone]   Remember:   Do not eat food:After Midnight.  Do not drink clear liquids: After Midnight.  Take these medicines the morning of surgery with A SIP OF WATER: none   Do not wear jewelry, make-up or nail polish.  Do not wear lotions, powders, or perfumes. Do not wear deodorant.  Do not shave 48 hours prior to surgery.  Do not bring valuables to the hospital.  [facility] is not   responsible for any belongings or valuables brought to the hospital.  Contacts, dentures or bridgework may not be worn into surgery.  Leave suitcase in the car. After surgery it may be brought to your room.  For patients admitted to the hospital, checkout time is 11:00 AM the day of              discharge.   Patients discharged the day of surgery will not be allowed to drive             home.  Name and phone number of your driver: na  Special Instructions:   N/A   Please read over the following fact sheets that you were given:   Surgical Site Infection Prevention

## 2016-10-31 ENCOUNTER — Encounter (HOSPITAL_COMMUNITY)
Admission: RE | Admit: 2016-10-31 | Discharge: 2016-10-31 | Disposition: A | Discharge status: Home / Self Care | Payer: Medicaid Other | Source: Ambulatory Visit

## 2016-10-31 ENCOUNTER — Encounter (HOSPITAL_COMMUNITY)
Admission: AD | Disposition: A | Discharge status: Home / Self Care | Payer: Self-pay | Source: Ambulatory Visit | Attending: Obstetrics

## 2016-10-31 ENCOUNTER — Inpatient Hospital Stay (HOSPITAL_COMMUNITY): Payer: Medicaid Other | Admitting: Anesthesiology

## 2016-10-31 ENCOUNTER — Encounter (HOSPITAL_COMMUNITY): Payer: Self-pay | Admitting: Anesthesiology

## 2016-10-31 DIAGNOSIS — O4202 Full-term premature rupture of membranes, onset of labor within 24 hours of rupture: Secondary | ICD-10-CM | POA: Diagnosis present

## 2016-10-31 HISTORY — DX: Personal history of other infectious and parasitic diseases: Z86.19

## 2016-10-31 LAB — CBC
HCT: 27.8 % — ABNORMAL LOW (ref 36.0–46.0)
Hemoglobin: 9.8 g/dL — ABNORMAL LOW (ref 12.0–15.0)
MCH: 30.4 pg (ref 26.0–34.0)
MCHC: 35.3 g/dL (ref 30.0–36.0)
MCV: 86.3 fL (ref 78.0–100.0)
PLATELETS: 177 10*3/uL (ref 150–400)
RBC: 3.22 MIL/uL — AB (ref 3.87–5.11)
RDW: 13.7 % (ref 11.5–15.5)
WBC: 12.6 10*3/uL — AB (ref 4.0–10.5)

## 2016-10-31 LAB — RPR: RPR Ser Ql: NONREACTIVE

## 2016-10-31 LAB — TYPE AND SCREEN
ABO/RH(D): O POS
Antibody Screen: NEGATIVE

## 2016-10-31 LAB — RAPID HIV SCREEN (HIV 1/2 AB+AG)
HIV 1/2 Antibodies: NONREACTIVE
HIV-1 P24 ANTIGEN - HIV24: NONREACTIVE

## 2016-10-31 SURGERY — Surgical Case
Anesthesia: Spinal | Site: Abdomen

## 2016-10-31 MED ORDER — PHENYLEPHRINE 8 MG IN D5W 100 ML (0.08MG/ML) PREMIX OPTIME
INJECTION | INTRAVENOUS | Status: AC
  Filled 2016-10-31: qty 100

## 2016-10-31 MED ORDER — PRENATAL MULTIVITAMIN CH
1.0000 | ORAL_TABLET | Freq: Every day | ORAL | Status: DC
  Administered 2016-10-31 – 2016-11-01 (×2): 1 via ORAL
  Filled 2016-10-31 (×2): qty 1

## 2016-10-31 MED ORDER — LACTATED RINGERS IV SOLN
INTRAVENOUS | Status: DC | PRN
  Administered 2016-10-31: 02:00:00 via INTRAVENOUS

## 2016-10-31 MED ORDER — MENTHOL 3 MG MT LOZG: 1.0000 | LOZENGE | OROMUCOSAL | Status: DC | PRN

## 2016-10-31 MED ORDER — SCOPOLAMINE 1 MG/3DAYS TD PT72
MEDICATED_PATCH | TRANSDERMAL | Status: AC
  Filled 2016-10-31: qty 1

## 2016-10-31 MED ORDER — ONDANSETRON HCL 4 MG/2ML IJ SOLN
INTRAMUSCULAR | Status: DC | PRN
  Administered 2016-10-31: 4 mg via INTRAVENOUS

## 2016-10-31 MED ORDER — TETANUS-DIPHTH-ACELL PERTUSSIS 5-2.5-18.5 LF-MCG/0.5 IM SUSP: 0.5000 mL | Freq: Once | INTRAMUSCULAR | Status: DC

## 2016-10-31 MED ORDER — IBUPROFEN 600 MG PO TABS
600.0000 mg | ORAL_TABLET | Freq: Four times a day (QID) | ORAL | Status: DC
  Administered 2016-10-31 – 2016-11-01 (×5): 600 mg via ORAL
  Filled 2016-10-31 (×6): qty 1

## 2016-10-31 MED ORDER — FENTANYL CITRATE (PF) 100 MCG/2ML IJ SOLN
INTRAMUSCULAR | Status: DC | PRN
  Administered 2016-10-31: 20 ug via INTRATHECAL

## 2016-10-31 MED ORDER — BUPIVACAINE IN DEXTROSE 0.75-8.25 % IT SOLN
INTRATHECAL | Status: DC | PRN
  Administered 2016-10-31: 12 mL via INTRATHECAL

## 2016-10-31 MED ORDER — SIMETHICONE 80 MG PO CHEW
80.0000 mg | CHEWABLE_TABLET | Freq: Three times a day (TID) | ORAL | Status: DC
  Administered 2016-10-31 – 2016-11-01 (×4): 80 mg via ORAL
  Filled 2016-10-31 (×5): qty 1

## 2016-10-31 MED ORDER — SODIUM CHLORIDE 0.9 % IR SOLN
Status: DC | PRN
  Administered 2016-10-31: 1000 mL

## 2016-10-31 MED ORDER — OXYTOCIN 40 UNITS IN LACTATED RINGERS INFUSION - SIMPLE MED: 2.5000 [IU]/h | INTRAVENOUS | Status: AC

## 2016-10-31 MED ORDER — SENNOSIDES-DOCUSATE SODIUM 8.6-50 MG PO TABS
2.0000 | ORAL_TABLET | ORAL | Status: DC
  Administered 2016-11-01: 2 via ORAL
  Filled 2016-10-31: qty 2

## 2016-10-31 MED ORDER — MORPHINE SULFATE (PF) 0.5 MG/ML IJ SOLN
INTRAMUSCULAR | Status: DC | PRN
  Administered 2016-10-31: .2 mg via INTRATHECAL

## 2016-10-31 MED ORDER — COCONUT OIL OIL
1.0000 "application " | TOPICAL_OIL | Status: DC | PRN
  Administered 2016-11-01: 1 via TOPICAL
  Filled 2016-10-31: qty 120

## 2016-10-31 MED ORDER — SOD CITRATE-CITRIC ACID 500-334 MG/5ML PO SOLN
ORAL | Status: AC
  Administered 2016-10-31: 30 mL
  Filled 2016-10-31: qty 15

## 2016-10-31 MED ORDER — FENTANYL CITRATE (PF) 100 MCG/2ML IJ SOLN
25.0000 ug | INTRAMUSCULAR | Status: DC | PRN
  Administered 2016-10-31: 25 ug via INTRAVENOUS

## 2016-10-31 MED ORDER — SCOPOLAMINE 1 MG/3DAYS TD PT72
MEDICATED_PATCH | TRANSDERMAL | Status: DC | PRN
  Administered 2016-10-31: 1 via TRANSDERMAL

## 2016-10-31 MED ORDER — ACETAMINOPHEN 325 MG PO TABS
650.0000 mg | ORAL_TABLET | ORAL | Status: DC | PRN
  Administered 2016-10-31 (×2): 650 mg via ORAL
  Filled 2016-10-31 (×2): qty 2

## 2016-10-31 MED ORDER — DEXAMETHASONE SODIUM PHOSPHATE 10 MG/ML IJ SOLN
INTRAMUSCULAR | Status: DC | PRN
  Administered 2016-10-31: 10 mg via INTRAVENOUS

## 2016-10-31 MED ORDER — SIMETHICONE 80 MG PO CHEW
80.0000 mg | CHEWABLE_TABLET | ORAL | Status: DC
  Administered 2016-11-01: 80 mg via ORAL
  Filled 2016-10-31: qty 1

## 2016-10-31 MED ORDER — MORPHINE SULFATE (PF) 0.5 MG/ML IJ SOLN
INTRAMUSCULAR | Status: AC
  Filled 2016-10-31: qty 10

## 2016-10-31 MED ORDER — OXYCODONE HCL 5 MG PO TABS: 5.0000 mg | ORAL_TABLET | ORAL | Status: DC | PRN

## 2016-10-31 MED ORDER — DIBUCAINE 1 % RE OINT: 1.0000 "application " | TOPICAL_OINTMENT | RECTAL | Status: DC | PRN

## 2016-10-31 MED ORDER — OXYTOCIN 10 UNIT/ML IJ SOLN
INTRAMUSCULAR | Status: DC | PRN
  Administered 2016-10-31: 40 [IU] via INTRAVENOUS

## 2016-10-31 MED ORDER — ONDANSETRON HCL 4 MG/2ML IJ SOLN: 4.0000 mg | Freq: Once | INTRAMUSCULAR | Status: DC | PRN

## 2016-10-31 MED ORDER — MEPERIDINE HCL 25 MG/ML IJ SOLN: 6.2500 mg | INTRAMUSCULAR | Status: DC | PRN

## 2016-10-31 MED ORDER — ONDANSETRON HCL 4 MG/2ML IJ SOLN
INTRAMUSCULAR | Status: AC
  Filled 2016-10-31: qty 2

## 2016-10-31 MED ORDER — SIMETHICONE 80 MG PO CHEW: 80.0000 mg | CHEWABLE_TABLET | ORAL | Status: DC | PRN

## 2016-10-31 MED ORDER — DEXAMETHASONE SODIUM PHOSPHATE 4 MG/ML IJ SOLN
INTRAMUSCULAR | Status: AC
  Filled 2016-10-31: qty 1

## 2016-10-31 MED ORDER — OXYCODONE HCL 5 MG PO TABS: 10.0000 mg | ORAL_TABLET | ORAL | Status: DC | PRN

## 2016-10-31 MED ORDER — FENTANYL CITRATE (PF) 100 MCG/2ML IJ SOLN
INTRAMUSCULAR | Status: AC
  Filled 2016-10-31: qty 2

## 2016-10-31 MED ORDER — LACTATED RINGERS IV SOLN
INTRAVENOUS | Status: DC
  Administered 2016-10-31: 14:00:00 via INTRAVENOUS

## 2016-10-31 MED ORDER — OXYTOCIN 10 UNIT/ML IJ SOLN
INTRAMUSCULAR | Status: AC
  Filled 2016-10-31: qty 4

## 2016-10-31 MED ORDER — DIPHENHYDRAMINE HCL 25 MG PO CAPS: 25.0000 mg | ORAL_CAPSULE | Freq: Four times a day (QID) | ORAL | Status: DC | PRN

## 2016-10-31 MED ORDER — PHENYLEPHRINE 8 MG IN D5W 100 ML (0.08MG/ML) PREMIX OPTIME
INJECTION | INTRAVENOUS | Status: DC | PRN
  Administered 2016-10-31: 60 ug/min via INTRAVENOUS

## 2016-10-31 MED ORDER — BUPIVACAINE IN DEXTROSE 0.75-8.25 % IT SOLN
INTRATHECAL | Status: AC
  Filled 2016-10-31: qty 2

## 2016-10-31 MED ORDER — WITCH HAZEL-GLYCERIN EX PADS: 1.0000 "application " | MEDICATED_PAD | CUTANEOUS | Status: DC | PRN

## 2016-10-31 MED ORDER — LACTATED RINGERS IV SOLN
INTRAVENOUS | Status: DC | PRN
  Administered 2016-10-31 (×2): via INTRAVENOUS

## 2016-10-31 SURGICAL SUPPLY — 35 items
BENZOIN TINCTURE PRP APPL 2/3 (GAUZE/BANDAGES/DRESSINGS) ×3 IMPLANT
CHLORAPREP W/TINT 26ML (MISCELLANEOUS) ×3 IMPLANT
CLAMP CORD UMBIL (MISCELLANEOUS) IMPLANT
CLOSURE WOUND 1/2 X4 (GAUZE/BANDAGES/DRESSINGS) ×1
CLOTH BEACON ORANGE TIMEOUT ST (SAFETY) ×3 IMPLANT
DERMABOND ADVANCED (GAUZE/BANDAGES/DRESSINGS)
DERMABOND ADVANCED .7 DNX12 (GAUZE/BANDAGES/DRESSINGS) IMPLANT
DRSG OPSITE POSTOP 4X10 (GAUZE/BANDAGES/DRESSINGS) ×3 IMPLANT
ELECT REM PT RETURN 9FT ADLT (ELECTROSURGICAL) ×3
ELECTRODE REM PT RTRN 9FT ADLT (ELECTROSURGICAL) ×1 IMPLANT
EXTRACTOR VACUUM M CUP 4 TUBE (SUCTIONS) IMPLANT
EXTRACTOR VACUUM M CUP 4' TUBE (SUCTIONS)
GLOVE BIO SURGEON STRL SZ7 (GLOVE) ×3 IMPLANT
GLOVE BIOGEL PI IND STRL 7.0 (GLOVE) ×1 IMPLANT
GLOVE BIOGEL PI INDICATOR 7.0 (GLOVE) ×2
GOWN STRL REUS W/TWL LRG LVL3 (GOWN DISPOSABLE) ×6 IMPLANT
KIT ABG SYR 3ML LUER SLIP (SYRINGE) IMPLANT
NEEDLE HYPO 22GX1.5 SAFETY (NEEDLE) IMPLANT
NEEDLE HYPO 25X5/8 SAFETYGLIDE (NEEDLE) IMPLANT
NS IRRIG 1000ML POUR BTL (IV SOLUTION) ×3 IMPLANT
PACK C SECTION WH (CUSTOM PROCEDURE TRAY) ×3 IMPLANT
PAD OB MATERNITY 4.3X12.25 (PERSONAL CARE ITEMS) ×3 IMPLANT
PENCIL SMOKE EVAC W/HOLSTER (ELECTROSURGICAL) ×3 IMPLANT
RTRCTR C-SECT PINK 25CM LRG (MISCELLANEOUS) ×3 IMPLANT
SPONGE LAP 18X18 X RAY DECT (DISPOSABLE) ×3 IMPLANT
STRIP CLOSURE SKIN 1/2X4 (GAUZE/BANDAGES/DRESSINGS) ×2 IMPLANT
SUT CHROMIC 1 CTX 36 (SUTURE) ×6 IMPLANT
SUT CHROMIC 2 0 CT 1 (SUTURE) ×3 IMPLANT
SUT PDS AB 0 CTX 60 (SUTURE) ×3 IMPLANT
SUT VIC AB 2-0 CT1 27 (SUTURE) ×2
SUT VIC AB 2-0 CT1 TAPERPNT 27 (SUTURE) ×1 IMPLANT
SUT VIC AB 4-0 KS 27 (SUTURE) IMPLANT
SYR 30ML LL (SYRINGE) IMPLANT
TOWEL OR 17X24 6PK STRL BLUE (TOWEL DISPOSABLE) ×3 IMPLANT
TRAY FOLEY BAG SILVER LF 14FR (SET/KITS/TRAYS/PACK) ×3 IMPLANT

## 2016-10-31 NOTE — Anesthesia Procedure Notes (Signed)
Spinal  Patient location during procedure: OR Start time: 10/31/2016 1:39 AM End time: 10/31/2016 1:45 AM Staffing Anesthesiologist: Tishana Clinkenbeard Preanesthetic Checklist Completed: patient identified, site marked, surgical consent, pre-op evaluation, timeout performed, IV checked, risks and benefits discussed and monitors and equipment checked Spinal Block Patient position: sitting Prep: DuraPrep Patient monitoring: heart rate, cardiac monitor, continuous pulse ox and blood pressure Approach: midline Location: L4-5 Injection technique: single-shot Needle Needle type: Sprotte  Needle gauge: 24 G Needle length: 9 cm Assessment Sensory level: T4

## 2016-10-31 NOTE — Transfer of Care (Signed)
Immediate Anesthesia Transfer of Care Note  Patient: Peggy Dodson  Procedure(s) Performed: Procedure(s): CESAREAN SECTION (N/A)  Patient Location: PACU  Anesthesia Type:Spinal  Level of Consciousness: awake, alert  and oriented  Airway & Oxygen Therapy: Patient Spontanous Breathing  Post-op Assessment: Report given to RN and Post -op Vital signs reviewed and stable  Post vital signs: Reviewed and stable  Last Vitals:  Vitals:   10/31/16 0259 10/31/16 0300  BP:    Pulse: 67 68  Resp: 18 17  Temp:    SpO2: 99% 100%    Last Pain:  Vitals:   10/31/16 0007  TempSrc: Oral  PainSc:          Complications: No apparent anesthesia complications

## 2016-10-31 NOTE — Lactation Note (Signed)
This note was copied from a baby's chart. Lactation Consultation Note  Patient Name: Peggy Dodson [id number] Date: 10/31/2016 Reason for consult: Initial assessment  Baby 10 hours old. Mom reports that she successfully nursed 2 older children. Mom attempted to latch baby in cradle position to left breast, but baby sleepy. Enc mom to support baby's head and latch in cross-cradle position, but baby sleepy still. Enc mom to hand express and dribble EBM in baby's mouth, which baby tolerated well, but still not willing to latch. Discussed newborn behavior and enc lots of STS and nursing/latching with cues. Mom given [facility] brochure, aware of OP/BFSG and LC phone line assistance after D/C.   Maternal Data Has patient been taught Hand Expression?: Yes Does the patient have breastfeeding experience prior to this delivery?: Yes  Feeding Feeding Type: Breast Fed Length of feed: 0 min  LATCH Score Latch: Too sleepy or reluctant, no latch achieved, no sucking elicited.  Audible Swallowing: None  Type of Nipple: Everted at rest and after stimulation  Comfort (Breast/Nipple): Soft / non-tender  Hold (Positioning): Assistance needed to correctly position infant at breast and maintain latch.  LATCH Score: 5  Interventions Interventions: Breast feeding basics reviewed;Assisted with latch;Skin to skin;Hand express;Breast compression;Adjust position;Support pillows;Position options;Expressed milk  Lactation Tools Discussed/Used     Consult Status Consult Status: Follow-up Date: 11/01/16 Follow-up type: In-patient    Sherlyn Hay 10/31/2016, 12:41 PM

## 2016-10-31 NOTE — Discharge Instructions (Signed)
Iron-Rich Diet  Iron is a mineral that helps your body to produce hemoglobin. Hemoglobin is a protein in your red blood cells that carries oxygen to your body's tissues. Eating too little iron may cause you to feel weak and tired, and it can increase your risk for infection. Eating enough iron is necessary for your body's metabolism, muscle function, and nervous system.  Iron is naturally found in many foods. It can also be added to foods or fortified in foods. There are two types of dietary iron:   Heme iron. Heme iron is absorbed by the body more easily than nonheme iron. Heme iron is found in meat, poultry, and fish.   Nonheme iron. Nonheme iron is found in dietary supplements, iron-fortified grains, beans, and vegetables.    You may need to follow an iron-rich diet if:   You have been diagnosed with iron deficiency or iron-deficiency anemia.   You have a condition that prevents you from absorbing dietary iron, such as:  ? Infection in your intestines.  ? Celiac disease. This involves long-lasting (chronic) inflammation of your intestines.   You do not eat enough iron.   You eat a diet that is high in foods that impair iron absorption.   You have lost a lot of blood.   You have heavy bleeding during your menstrual cycle.   You are pregnant.    What is my plan?  Your health care provider may help you to determine how much iron you need per day based on your condition. Generally, when a person consumes sufficient amounts of iron in the diet, the following iron needs are met:   Men.  ? 14-18 years old: 11 mg per day.  ? 19-50 years old: 8 mg per day.   Women.  ? 14-18 years old: 15 mg per day.  ? 19-50 years old: 18 mg per day.  ? Over 50 years old: 8 mg per day.  ? Pregnant women: 27 mg per day.  ? Breastfeeding women: 9 mg per day.    What do I need to know about an iron-rich diet?   Eat fresh fruits and vegetables that are high in vitamin C along with foods that are high in iron. This will help  increase the amount of iron that your body absorbs from food, especially with foods containing nonheme iron. Foods that are high in vitamin C include oranges, peppers, tomatoes, and mango.   Take iron supplements only as directed by your health care provider. Overdose of iron can be life-threatening. If you were prescribed iron supplements, take them with orange juice or a vitamin C supplement.   Cook foods in pots and pans that are made from iron.   Eat nonheme iron-containing foods alongside foods that are high in heme iron. This helps to improve your iron absorption.   Certain foods and drinks contain compounds that impair iron absorption. Avoid eating these foods in the same meal as iron-rich foods or with iron supplements. These include:  ? Coffee, black tea, and red wine.  ? Milk, dairy products, and foods that are high in calcium.  ? Beans, soybeans, and peas.  ? Whole grains.   When eating foods that contain both nonheme iron and compounds that impair iron absorption, follow these tips to absorb iron better.  ? Soak beans overnight before cooking.  ? Soak whole grains overnight and drain them before using.  ? Ferment flours before baking, such as using yeast in bread dough.    What foods can I eat?  Grains  Iron-fortified breakfast cereal. Iron-fortified whole-wheat bread. Enriched rice. Sprouted grains.  Vegetables  Spinach. Potatoes with skin. Green peas. Broccoli. Red and green bell peppers. Fermented vegetables.  Fruits  Prunes. Raisins. Oranges. Strawberries. Mango. Grapefruit.  Meats and Other Protein Sources  Beef liver. Oysters. Beef. Shrimp. Turkey. Chicken. Tuna. Sardines. Chickpeas. Nuts. Tofu.  Beverages  Tomato juice. Fresh orange juice. Prune juice. Hibiscus tea. Fortified instant breakfast shakes.  Condiments  Tahini. Fermented soy sauce.  Sweets and Desserts  Black-strap molasses.  Other  Wheat germ.  The items listed above may not be a complete list of recommended foods or beverages.  Contact your dietitian for more options.  What foods are not recommended?  Grains  Whole grains. Bran cereal. Bran flour. Oats.  Vegetables  Artichokes. Brussels sprouts. Kale.  Fruits  Blueberries. Raspberries. Strawberries. Figs.  Meats and Other Protein Sources  Soybeans. Products made from soy protein.  Dairy  Milk. Cream. Cheese. Yogurt. Cottage cheese.  Beverages  Coffee. Black tea. Red wine.  Sweets and Desserts  Cocoa. Chocolate. Ice cream.  Other  Basil. Oregano. Parsley.  The items listed above may not be a complete list of foods and beverages to avoid. Contact your dietitian for more information.  This information is not intended to replace advice given to you by your health care provider. Make sure you discuss any questions you have with your health care provider.  Document Released: 10/15/2004 Document Revised: 09/21/2015 Document Reviewed: 09/28/2013  Elsevier Interactive Patient Education  2018 [employer]

## 2016-10-31 NOTE — Anesthesia Preprocedure Evaluation (Signed)
Anesthesia Evaluation  Patient identified by MRN, date of birth, ID band Patient awake    Reviewed: Allergy & Precautions, H&P , NPO status , Patient's Chart, lab work & pertinent test results, reviewed documented beta blocker date and time   Airway Mallampati: I  TM Distance: >3 FB Neck ROM: full    Dental no notable dental hx.    Pulmonary neg pulmonary ROS, former smoker,    Pulmonary exam normal breath sounds clear to auscultation       Cardiovascular negative cardio ROS Normal cardiovascular exam Rhythm:regular Rate:Normal     Neuro/Psych negative neurological ROS  negative psych ROS   GI/Hepatic negative GI ROS, Neg liver ROS, GERD  ,  Endo/Other  negative endocrine ROS  Renal/GU negative Renal ROS  negative genitourinary   Musculoskeletal   Abdominal   Peds  Hematology negative hematology ROS (+)   Anesthesia Other Findings   Reproductive/Obstetrics (+) Pregnancy                             Anesthesia Physical Anesthesia Plan  ASA: II and emergent  Anesthesia Plan: Spinal   Post-op Pain Management:    Induction:   PONV Risk Score and Plan: 2 and Ondansetron, Dexamethasone, Treatment may vary due to age or medical condition and Treatment may vary due to age  Airway Management Planned:   Additional Equipment:   Intra-op Plan:   Post-operative Plan:   Informed Consent: I have reviewed the patients History and Physical, chart, labs and discussed the procedure including the risks, benefits and alternatives for the proposed anesthesia with the patient or authorized representative who has indicated his/her understanding and acceptance.     Plan Discussed with:   Anesthesia Plan Comments:         Anesthesia Quick Evaluation

## 2016-10-31 NOTE — Op Note (Signed)
Cesarean Section Procedure Note  Pre-operative Diagnosis: 1. Intrauterine pregnancy at [redacted]w[redacted]d  2. Rupture of membranes  Post-operative Diagnosis: same as above  Surgeon: Marlow Baars, MD  Procedure: Repeat low transverse cesarean section   Anesthesia: Spinal anesthesia  Estimated Blood Loss: 857 mL         Drains: Foley catheter                 Complications:  None; patient tolerated the procedure well.         Disposition: PACU - hemodynamically stable.  Findings:  Normal uterus, tubes and ovaries bilaterally.  Viable female infant, weight pending, Apgars 9, 9.    Procedure Details   After spinal anesthesia was found to adequate , the patient was placed in the dorsal supine position with a leftward tilt, draped and prepped in the usual sterile manner. A Pfannenstiel incision was made and carried down through the subcutaneous tissue to the fascia. The fascia was incised in the midline and the fascial incision was extended laterally with Mayo scissors. The superior aspect of the fascial incision was grasped with two Kocher clamp, tented up and the rectus muscles dissected off sharply. The rectus was then dissected off with blunt dissection and Mayo scissors inferiorly. The rectus muscles were separated in the midline. The abdominal peritoneum was identified, tented up, entered sharply, and the incision was extended superiorly and inferiorly with good visualization of the bladder. The Alexis retractor was deployed. The vesicouterine peritoneum was identified and sharp dissection was used to create the bladder flap. Scalpel was then used to make a low transverse incision on the uterus which was extended in the cephalad-caudad direction with blunt dissection. The fluid was clear. The fetal vertex was identified, elevated out of the pelvis and brought to the hysterotomy. The head was delivered easily followed by the shoulders and body. After one minute delay, the cord was clamped and cut and the  infant was passed to the waiting neonatologist. Placenta was then delivered spontaneously, intact and appear normal, the uterus was cleared of all clot and debris   The hysterotomy was repaired with #0 Monocryl in running locked fashion.  The hysterotomy was reexamined and excellent hemostasis was noted. The Alexis retractor was removed from the abdomen. The peritoneum was examined and all vessels noted to be hemostatic. The abdominal cavity was cleared of all clot and debris.  The peritoneum was closed with 2-0 vicryl in a running fashion. The rectus muscles were reapproximated with 2-0 vicryl in a running fashion. The fascia and rectus muscles were inspected and were hemostatic. The fascia was closed with 0 Vicryl in a running fashion. The subcuticular layer was irrigated and all bleeders cauterized.  The subcutaneous layer was re approximated with interrupted 3-0 plain gut.  The skin was closed with 3-0 monocryl in a subcuticular fashion. The incision was dressed with benzoine, steri strips and honeycomb dressing. All sponge lap and needle counts were correct x3. Patient tolerated the procedure well and recovered in stable condition following the procedure.

## 2016-10-31 NOTE — Brief Op Note (Signed)
10/30/2016 - 10/31/2016  2:46 AM  PATIENT:  Peggy Dodson  21 y.o. female  PRE-OPERATIVE DIAGNOSIS:  repeat cesarean section   POST-OPERATIVE DIAGNOSIS:  repeat cesarean section   PROCEDURE:  Procedure(s): CESAREAN SECTION (N/A)  SURGEON:  Surgeon(s) and Role:    Marlow Baars, MD - Primary  ANESTHESIA:   spinal  EBL:  Total I/O In: 2400 [I.V.:2400] Out: 957 [Urine:100; Blood:857]  BLOOD ADMINISTERED:none  DRAINS: none   LOCAL MEDICATIONS USED:  NONE  SPECIMEN:  No Specimen  DISPOSITION OF SPECIMEN:  N/A  COUNTS:  YES  TOURNIQUET:  * No tourniquets in log *  DICTATION: .Note written in EPIC  PLAN OF CARE: Admit to inpatient   PATIENT DISPOSITION:  PACU - hemodynamically stable.   Delay start of Pharmacological VTE agent (>24hrs) due to surgical blood loss or risk of bleeding: not applicable

## 2016-11-01 LAB — BIRTH TISSUE RECOVERY COLLECTION (PLACENTA DONATION)

## 2016-11-01 MED ORDER — IBUPROFEN 600 MG PO TABS: 600.0000 mg | ORAL_TABLET | Freq: Four times a day (QID) | ORAL | 0 refills | Status: AC

## 2016-11-01 MED ORDER — OXYCODONE HCL 5 MG PO TABS: 5.0000 mg | ORAL_TABLET | ORAL | 0 refills | Status: AC | PRN

## 2016-11-01 NOTE — Progress Notes (Signed)
POD#2 Pt desires discharge, She is doing well VSSAF IMP/ stable Plan/ Discharge

## 2016-11-01 NOTE — Discharge Summary (Signed)
Obstetric Discharge Summary Reason for Admission: cesarean section and rupture of membranes Prenatal Procedures: ultrasound Intrapartum Procedures: cesarean: low cervical, transverse Postpartum Procedures: none Complications-Operative and Postpartum: none Hemoglobin  Date Value Ref Range Status  10/31/2016 9.8 (L) 12.0 - 15.0 g/dL Final   HCT  Date Value Ref Range Status  10/31/2016 27.8 (L) 36.0 - 46.0 % Final    Physical Exam:  General: alert Lochia: appropriate Uterine Fundus: firm Incision: healing well DVT Evaluation: No evidence of DVT seen on physical exam.  Discharge Diagnoses: Term Pregnancy-delivered  Discharge Information: Date: 11/01/2016 Activity: pelvic rest Diet: routine Medications: PNV, Ibuprofen and Percocet Condition: stable Instructions: refer to practice specific booklet Discharge to: home Follow-up Information    Marlow Baars, MD. Schedule an appointment as soon as possible for a visit in 1 month(s).   Specialty:  Obstetrics Contact information: [address] [place] [address] [phone]           Newborn Data: Live born female  Birth Weight: 8 lb 6 oz (3800 g) APGAR: 9, 9  Home with mother.  ANDERSON,MARK E 11/01/2016, 8:28 AM

## 2016-11-03 ENCOUNTER — Inpatient Hospital Stay (HOSPITAL_COMMUNITY)
Admission: RE | Admit: 2016-11-03 | Payer: Medicaid Other | Source: Ambulatory Visit | Admitting: Obstetrics and Gynecology

## 2016-11-03 NOTE — Anesthesia Postprocedure Evaluation (Signed)
Anesthesia Post Note  Patient: IRISSA FOGLIA  Procedure(s) Performed: Procedure(s) (LRB): CESAREAN SECTION (N/A)     Patient location during evaluation: PACU Anesthesia Type: Spinal Level of consciousness: oriented and awake and alert Pain management: pain level controlled Vital Signs Assessment: post-procedure vital signs reviewed and stable Respiratory status: spontaneous breathing, respiratory function stable and patient connected to nasal cannula oxygen Cardiovascular status: blood pressure returned to baseline and stable Postop Assessment: no headache and no backache Anesthetic complications: no    Last Vitals:  Vitals:   11/01/16 0010 11/01/16 0540  BP: 114/64 113/70  Pulse: 61 62  Resp: 18 18  Temp: 36.4 C 36.9 C  SpO2:      Last Pain:  Vitals:   11/01/16 1142  TempSrc:   PainSc: 4                  Merrit Friesen

## 2017-04-14 IMAGING — US US MFM OB DETAIL+14 WK
1 series · 13 of 28 positions shown · non-contrast
Comparison: none

[Series 1: us mfm ob detail+14 wk · 67 acquisitions, 13 frames shown]
[im 3/67]
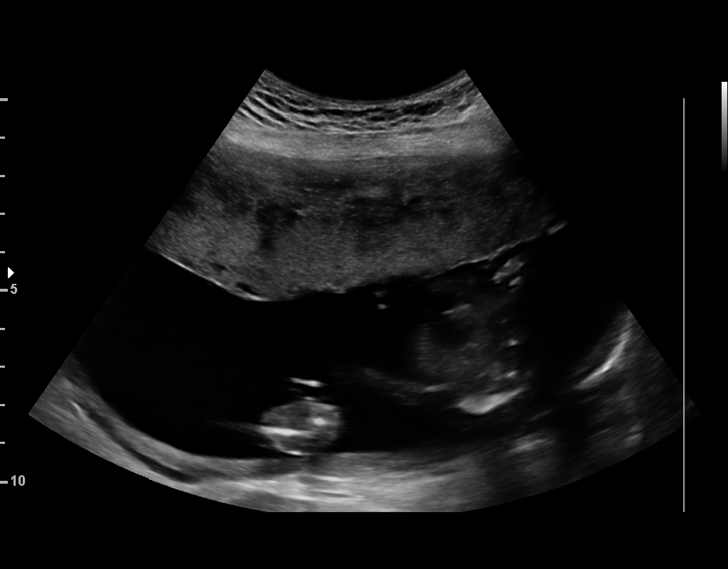
[im 8/67]
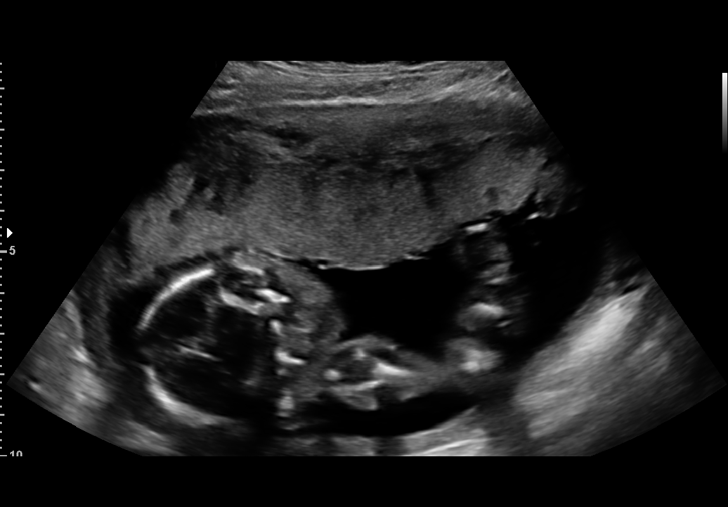
[im 13/67]
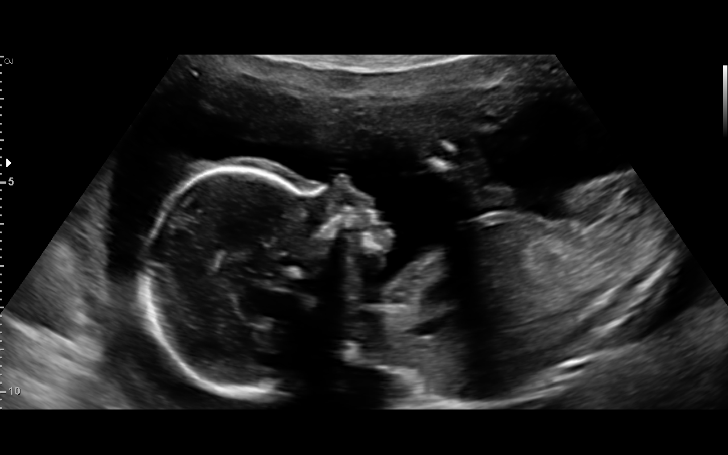
[im 18/67]
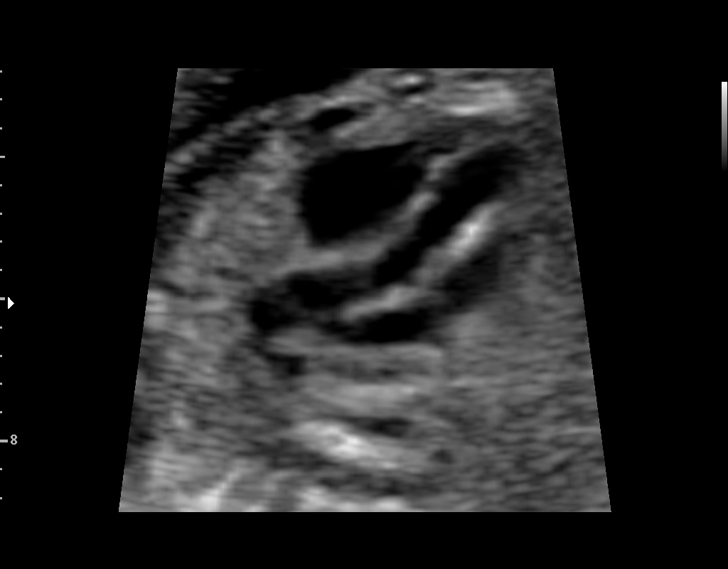
[im 23/67]
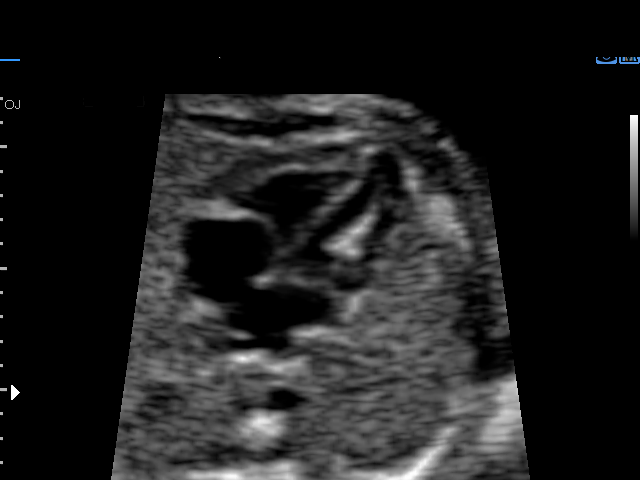
[im 27/67]
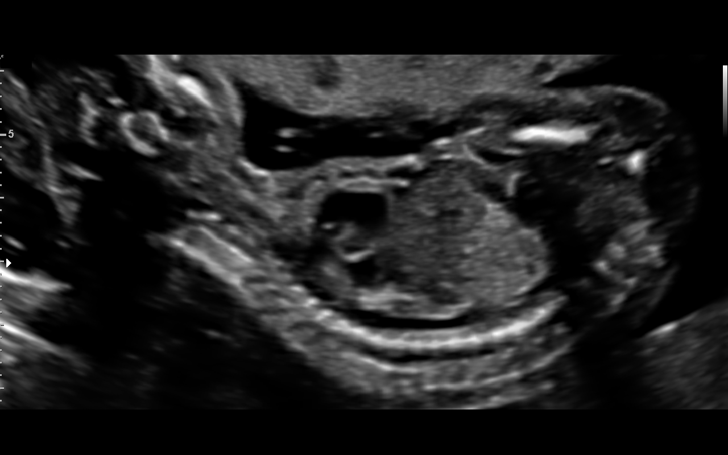
[im 35/67]
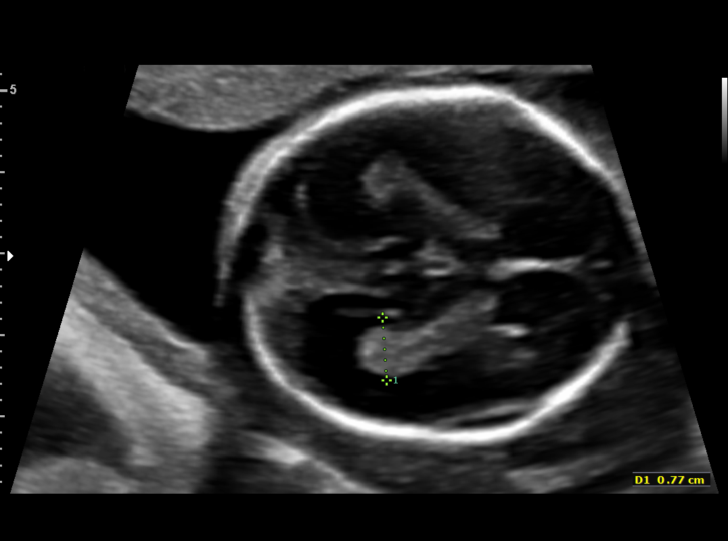
[im 40/67]
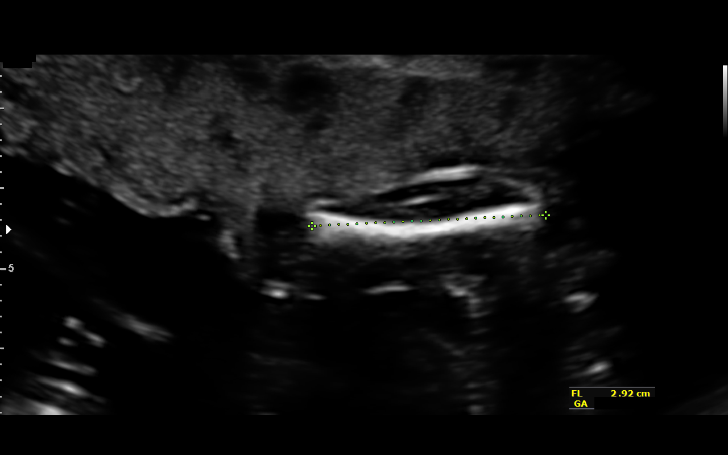
[im 45/67]
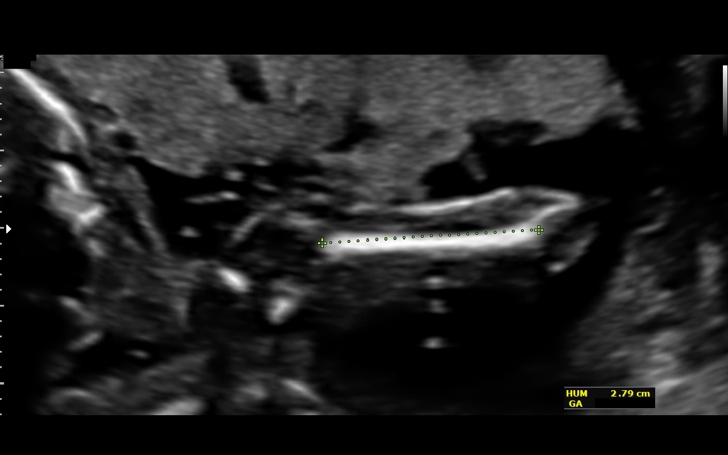
[im 49/67]
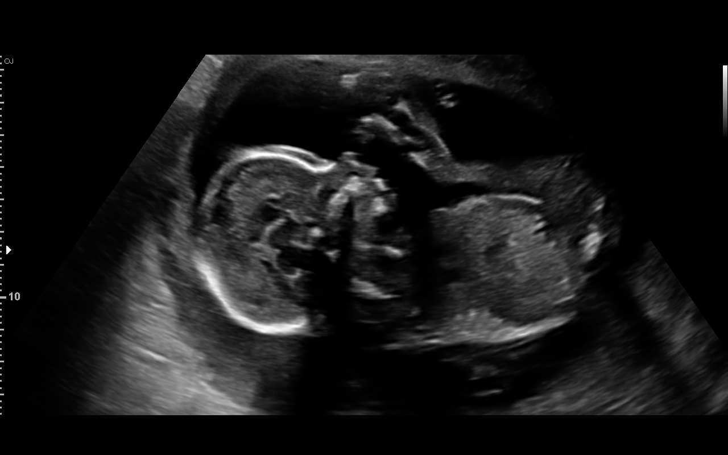
[im 54/67]
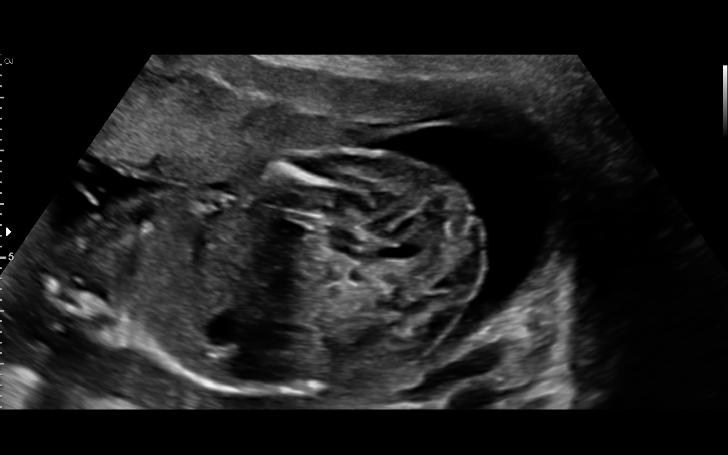
[im 59/67]
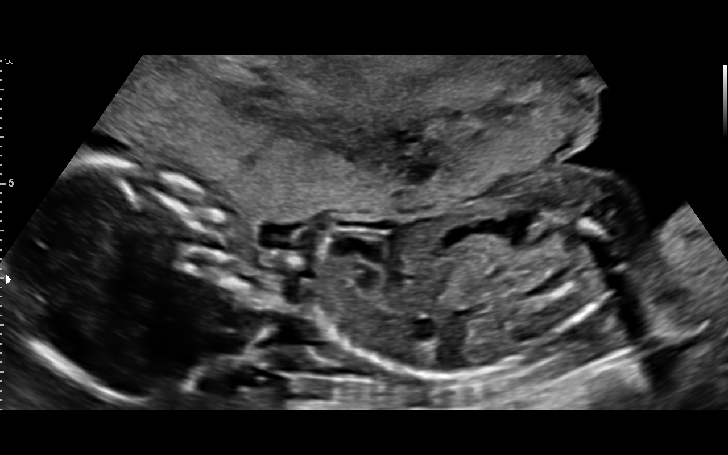
[im 64/67]
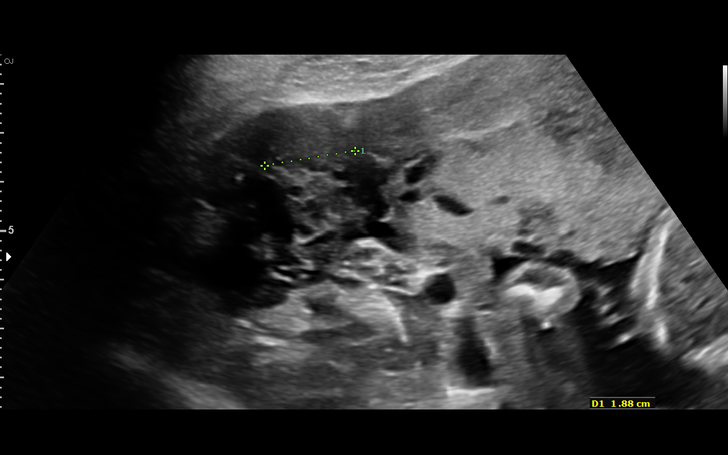

[13 of 28 positions shown; findings below may reference images not displayed]

1  LAIMUI SAFRY         [id number]      [id number]     [id number]
Indications

19 weeks gestation of pregnancy
Encounter for antenatal screening for
malformations
Abnormal fetal ultrasound (mutliple
calcifications in LV)
Previous cesarean delivery, antepartum x2
OB History

Blood Type:            Height:  5'6"   Weight (lb):  189      BMI:
Gravidity:    3         Term:   2        Prem:   0        SAB:   0
TOP:          0       Ectopic:  0        Living: 2
Fetal Evaluation

Num Of Fetuses:     1
Fetal Heart         137
Rate(bpm):
Cardiac Activity:   Observed
Presentation:       Breech
Placenta:           Anterior, above cervical os
P. Cord Insertion:  Visualized

Amniotic Fluid
AFI FV:      Subjectively within normal limits

Largest Pocket(cm)
3.04
Biometry
BPD:      41.8  mm     G. Age:  18w 4d         12  %    CI:        76.07   %   70 - 86
FL/HC:      19.3   %   16.8 -
HC:      151.9  mm     G. Age:  18w 1d        < 3  %    HC/AC:      1.09       1.09 -
AC:       139   mm     G. Age:  19w 2d         32  %    FL/BPD:     70.1   %
FL:       29.3  mm     G. Age:  19w 0d         20  %    FL/AC:      21.1   %   20 - 24
HUM:        29  mm     G. Age:  19w 3d         45  %
CER:      20.3  mm     G. Age:  19w 2d         42  %
CM:        5.3  mm

Est. FW:     272  gm    0 lb 10 oz      35  %
Gestational Age

LMP:           19w 5d       Date:   01/31/16                 EDD:   11/06/16
U/S Today:     18w 5d                                        EDD:   11/13/16
Best:          19w 5d    Det. By:   LMP  (01/31/16)          EDD:   11/06/16
Anatomy

Cranium:               Appears normal         Aortic Arch:            Appears normal
Cavum:                 Appears normal         Ductal Arch:            Appears normal
Ventricles:            Appears normal         Diaphragm:              Appears normal
Choroid Plexus:        Appears normal         Stomach:                Appears normal, left
sided
Cerebellum:            Appears normal         Abdomen:                Echogenic Bowel
Posterior Fossa:       Appears normal         Abdominal Wall:         Not well visualized
Nuchal Fold:           Appears normal         Cord Vessels:           Appears normal (3
vessel cord)
Face:                  Appears normal         Kidneys:                Appear normal
(orbits and profile)
Lips:                  Not well visualized    Bladder:                Appears normal
Thoracic:              Appears normal         Spine:                  Not well visualized
Heart:                 Echogenic focus        Upper Extremities:      Appears normal
in LV
RVOT:                  Appears normal         Lower Extremities:      Appears normal
LVOT:                  Appears normal

Other:  Gender not well visualized.
Cervix Uterus Adnexa

Cervix
Length:           3.28  cm.
Normal appearance by transabdominal scan.

Uterus
No abnormality visualized.
Impression

Single living intrauterine pregnancy at 11w3d.
Appropriate fetal growth (35%).
Normal amniotic fluid volume.
The fetal anatomic survey is complete.
An echogenic area is noted within the left ventricle. It is
different in shape than a typical intracardiac echogenic focus
but does not appear to be a separate mass. In some views it
appears contiguous with the mitral valve and may represent a
calcified valve leaflet.
Echogenic bowel visualized.
Normal fetal anatomy.
No other fetal anomalies or soft markers of aneuploidy seen.
Recommendations

The findings of today's ultrasound were reviewed with the
patient in detail.
Recommend fetal echocardiogram, arranged today.
Given echogenic bowel recommend sending CMV IgM and
IgG serologies, CF testing. Low-risk first trimester screen.
Follow-up ultrasound in 4 weeks to reassess growth and
anatomy.
